# Patient Record
Sex: Male | Born: 1980 | Race: Black or African American | Hispanic: No | Marital: Married | Smoking: Current every day smoker
Health system: Southern US, Community
[De-identification: ages and names within clinical notes are randomized; demographics above are authoritative.]

## PROBLEM LIST (undated history)

## (undated) DIAGNOSIS — E7800 Pure hypercholesterolemia, unspecified: Secondary | ICD-10-CM

## (undated) DIAGNOSIS — F419 Anxiety disorder, unspecified: Secondary | ICD-10-CM

---

## 2011-04-24 ENCOUNTER — Ambulatory Visit: Payer: Self-pay

## 2011-12-06 ENCOUNTER — Ambulatory Visit: Payer: Self-pay | Admitting: Emergency Medicine

## 2012-08-18 ENCOUNTER — Ambulatory Visit: Payer: Self-pay | Admitting: Family Medicine

## 2013-10-15 ENCOUNTER — Ambulatory Visit: Payer: Self-pay | Admitting: Physician Assistant

## 2013-12-30 ENCOUNTER — Emergency Department: Payer: Self-pay | Admitting: Emergency Medicine

## 2017-02-16 ENCOUNTER — Encounter: Payer: Self-pay | Admitting: *Deleted

## 2017-02-16 ENCOUNTER — Emergency Department
Admission: EM | Admit: 2017-02-16 | Discharge: 2017-02-16 | Disposition: A | Discharge status: Home / Self Care | Payer: BLUE CROSS/BLUE SHIELD | Attending: Emergency Medicine | Admitting: Emergency Medicine

## 2017-02-16 DIAGNOSIS — S0592XA Unspecified injury of left eye and orbit, initial encounter: Secondary | ICD-10-CM | POA: Diagnosis present

## 2017-02-16 DIAGNOSIS — S0502XA Injury of conjunctiva and corneal abrasion without foreign body, left eye, initial encounter: Secondary | ICD-10-CM | POA: Insufficient documentation

## 2017-02-16 DIAGNOSIS — Y929 Unspecified place or not applicable: Secondary | ICD-10-CM | POA: Diagnosis not present

## 2017-02-16 DIAGNOSIS — Y9389 Activity, other specified: Secondary | ICD-10-CM | POA: Diagnosis not present

## 2017-02-16 DIAGNOSIS — Y999 Unspecified external cause status: Secondary | ICD-10-CM | POA: Insufficient documentation

## 2017-02-16 DIAGNOSIS — W208XXA Other cause of strike by thrown, projected or falling object, initial encounter: Secondary | ICD-10-CM | POA: Diagnosis not present

## 2017-02-16 MED ORDER — FLUORESCEIN SODIUM 0.6 MG OP STRP
1.0000 | ORAL_STRIP | Freq: Once | OPHTHALMIC | Status: AC
  Administered 2017-02-16: 1 via OPHTHALMIC
  Filled 2017-02-16: qty 1

## 2017-02-16 MED ORDER — POLYMYXIN B-TRIMETHOPRIM 10000-0.1 UNIT/ML-% OP SOLN: 2.0000 [drp] | Freq: Four times a day (QID) | OPHTHALMIC | 0 refills | Status: DC

## 2017-02-16 MED ORDER — KETOROLAC TROMETHAMINE 0.5 % OP SOLN: 1.0000 [drp] | Freq: Four times a day (QID) | OPHTHALMIC | 0 refills | Status: DC

## 2017-02-16 MED ORDER — TETRACAINE HCL 0.5 % OP SOLN
2.0000 [drp] | Freq: Once | OPHTHALMIC | Status: AC
  Administered 2017-02-16: 2 [drp] via OPHTHALMIC
  Filled 2017-02-16: qty 4

## 2017-02-16 NOTE — ED Provider Notes (Signed)
[facility] Emergency Department Provider Note  ____________________________________________  Time seen: Approximately 11:12 PM  I have reviewed the triage vital signs and the nursing notes.   HISTORY  Chief Complaint Eye Pain    HPI Bruce Evans is a 36 y.o. male who presents to emergency department complaining of left eye pain. Patient reports he was trying to change a headlight this morning when the plastic broke and hit him in the eye. Since then, patient has had pain and excessive tearing to his left eye. Patient denies any foreign body sensation. He denies any other injury or complaint. No medications prior to arrival.   No past medical history on file.  There are no active problems to display for this patient.   No past surgical history on file.  Prior to Admission medications   Medication Sig Start Date End Date Taking? Authorizing Provider  ketorolac (ACULAR) 0.5 % ophthalmic solution Place 1 drop into the left eye 4 (four) times daily. 02/16/17   Miya Luviano, Delorise Royals, PA-C  trimethoprim-polymyxin b (POLYTRIM) ophthalmic solution Place 2 drops into the left eye every 6 (six) hours. 02/16/17   Jhanae Jaskowiak, Delorise Royals, PA-C    Allergies Patient has no known allergies.  No family history on file.  Social History Social History  Substance Use Topics  . Smoking status: Never Smoker  . Smokeless tobacco: Never Used  . Alcohol use Yes     Review of Systems  Constitutional: No fever/chills Eyes: No visual changes. No discharge. Positive for pain and excessive tearing to the left eye ENT: No upper respiratory complaints. Cardiovascular: no chest pain. Respiratory: no cough. No SOB. Gastrointestinal: No abdominal pain.  No nausea, no vomiting.  Musculoskeletal: Negative for musculoskeletal pain. Skin: Negative for rash, abrasions, lacerations, ecchymosis. Neurological: Negative for headaches, focal weakness or numbness. 10-point ROS otherwise  negative.  ____________________________________________   PHYSICAL EXAM:  VITAL SIGNS: ED Triage Vitals  Enc Vitals Group     BP 02/16/17 2232 (!) 151/79     Pulse Rate 02/16/17 2232 70     Resp 02/16/17 2232 16     Temp 02/16/17 2232 98.7 F (37.1 C)     Temp Source 02/16/17 2232 Oral     SpO2 02/16/17 2232 99 %     Weight 02/16/17 2229 235 lb (106.6 kg)     Height 02/16/17 2229 5\' 9"  (1.753 m)     Head Circumference --      Peak Flow --      Pain Score 02/16/17 2228 6     Pain Loc --      Pain Edu? --      Excl. in GC? --      Constitutional: Alert and oriented. Well appearing and in no acute distress. Eyes: Conjunctivae are normal. PERRL. EOMI.Initial exam funduscopic exam reveals red reflex bilaterally, good vasculature and optic disc. No visible laceration or foreign body. Eyes anesthetized using tetracaine. Fluorescein staining is applied with area of uptake in the 1:00 position. This is consistent with corneal abrasion. No visible foreign body. Head: Atraumatic. ENT:      Ears:       Nose: No congestion/rhinnorhea.      Mouth/Throat: Mucous membranes are moist.  Neck: No stridor.    Cardiovascular: Normal rate, regular rhythm. Normal S1 and S2.  Good peripheral circulation. Respiratory: Normal respiratory effort without tachypnea or retractions. Lungs CTAB. Good air entry to the bases with no decreased or absent breath sounds Musculoskeletal: Full range  of motion to all extremities. No gross deformities appreciated. Neurologic:  Normal speech and language. No gross focal neurologic deficits are appreciated.  Skin:  Skin is warm, dry and intact. No rash noted. Psychiatric: Mood and affect are normal. Speech and behavior are normal. Patient exhibits appropriate insight and judgement.   ____________________________________________   LABS (all labs ordered are listed, but only abnormal results are displayed)  Labs Reviewed - No data to  display ____________________________________________  EKG   ____________________________________________  RADIOLOGY   No results found.  ____________________________________________    PROCEDURES  Procedure(s) performed:    Procedures    Medications  fluorescein ophthalmic strip 1 strip (1 strip Left Eye Given 02/16/17 2301)  tetracaine (PONTOCAINE) 0.5 % ophthalmic solution 2 drop (2 drops Left Eye Given 02/16/17 2301)     ____________________________________________   INITIAL IMPRESSION / ASSESSMENT AND PLAN / ED COURSE  Pertinent labs & imaging results that were available during my care of the patient were reviewed by me and considered in my medical decision making (see chart for details).  Review of the [place] CSRS was performed in accordance of the [facility] prior to dispensing any controlled drugs.     Patient's diagnosis is consistent with corneal abrasion to the left eye.. Patient will be discharged home with prescriptions for Polytrim and Acular. Patient is to follow up with ophthalmology as needed or otherwise directed. Patient is given ED precautions to return to the ED for any worsening or new symptoms.     ____________________________________________  FINAL CLINICAL IMPRESSION(S) / ED DIAGNOSES  Final diagnoses:  Abrasion of left cornea, initial encounter      NEW MEDICATIONS STARTED DURING THIS VISIT:  New Prescriptions   KETOROLAC (ACULAR) 0.5 % OPHTHALMIC SOLUTION    Place 1 drop into the left eye 4 (four) times daily.   TRIMETHOPRIM-POLYMYXIN B (POLYTRIM) OPHTHALMIC SOLUTION    Place 2 drops into the left eye every 6 (six) hours.        This chart was dictated using voice recognition software/Dragon. Despite best efforts to proofread, errors can occur which can change the meaning. Any change was purely unintentional.    Racheal Patches, PA-C 02/16/17 2316    Dionne Bucy, MD 02/16/17 [phone]

## 2017-02-16 NOTE — ED Notes (Addendum)
Pt was changing headlight in car and plastic hit him in left eye. Eye throbbing and watering.

## 2017-02-16 NOTE — ED Triage Notes (Signed)
Pt has pain in left eye.  States was changing a headlight this morning and a piece of plastic struck pt in left eye,  No loc.  Pt has no blurred vision.  Pt alert.

## 2018-01-24 ENCOUNTER — Encounter: Payer: Self-pay | Admitting: Emergency Medicine

## 2018-01-24 ENCOUNTER — Other Ambulatory Visit: Payer: Self-pay

## 2018-01-24 ENCOUNTER — Ambulatory Visit
Admission: EM | Admit: 2018-01-24 | Discharge: 2018-01-24 | Disposition: A | Discharge status: Home / Self Care | Payer: BLUE CROSS/BLUE SHIELD | Attending: Family Medicine | Admitting: Family Medicine

## 2018-01-24 DIAGNOSIS — H6981 Other specified disorders of Eustachian tube, right ear: Secondary | ICD-10-CM

## 2018-01-24 MED ORDER — NAPROXEN 500 MG PO TABS: 500.0000 mg | ORAL_TABLET | Freq: Two times a day (BID) | ORAL | 0 refills | Status: DC

## 2018-01-24 MED ORDER — FLUTICASONE PROPIONATE 50 MCG/ACT NA SUSP: 2.0000 | Freq: Every day | NASAL | 0 refills | Status: DC

## 2018-01-24 MED ORDER — FEXOFENADINE-PSEUDOEPHED ER 60-120 MG PO TB12: 1.0000 | ORAL_TABLET | Freq: Two times a day (BID) | ORAL | 0 refills | Status: DC

## 2018-01-24 NOTE — ED Provider Notes (Signed)
[facility]    CSN: [id number] Arrival date & time: 01/24/18  1803     History   Chief Complaint Chief Complaint  Patient presents with  . Otalgia    right    HPI Bruce Evans is a 37 y.o. male.   HPI -year-old male presents with right ear pain that started this morning.  Actually it has been hurting him on and off for over several weeks.  His morning is particularly worse with the pain mostly inferior to the ear itself and into the throat.  In addition he has had pain on the crown of his head that is not tender but just feels that when the more severely today.  He had no tinnitus.  He has had no discharge from the ear.  No decrease in hearing.  [place] but  denies any flights recent colds or allergies.  No fever or chills he is afebrile today.         History reviewed. No pertinent past medical history.  There are no active problems to display for this patient.   History reviewed. No pertinent surgical history.     Home Medications    Prior to Admission medications   Medication Sig Start Date End Date Taking? Authorizing Provider  atorvastatin (LIPITOR) 10 MG tablet  01/04/18   [provider]  fexofenadine-pseudoephedrine (ALLEGRA-D) 60-120 MG 12 hr tablet Take 1 tablet by mouth every 12 (twelve) hours. 01/24/18   Lutricia Feil, PA-C  fluticasone (FLONASE) 50 MCG/ACT nasal spray Place 2 sprays into both nostrils daily. 01/24/18   Lutricia Feil, PA-C  naproxen (NAPROSYN) 500 MG tablet Take 1 tablet (500 mg total) by mouth 2 (two) times daily. 01/24/18   Ovid Curd P, PA-C  VIIBRYD 20 MG TABS TAKE 1 TABLET BY MOUTH ONCE DAILY FOR 30 DAYS 01/07/18   [provider]    Family History History reviewed. No pertinent family history.  Social History Social History   Tobacco Use  . Smoking status: Never Smoker  . Smokeless tobacco: Never Used  Substance Use Topics  . Alcohol use: Yes  . Drug use:  No     Allergies   Patient has no known allergies.   Review of Systems Review of Systems  Constitutional: Positive for activity change. Negative for appetite change, chills, fatigue and fever.  HENT: Positive for ear pain. Negative for ear discharge and tinnitus.   All other systems reviewed and are negative.    Physical Exam Triage Vital Signs ED Triage Vitals  Enc Vitals Group     BP 01/24/18 1817 121/87     Pulse Rate 01/24/18 1817 69     Resp 01/24/18 1817 16     Temp 01/24/18 1817 98.8 F (37.1 C)     Temp Source 01/24/18 1817 Oral     SpO2 01/24/18 1817 99 %     Weight 01/24/18 1814 255 lb (115.7 kg)     Height 01/24/18 1814 5\' 9"  (1.753 m)     Head Circumference --      Peak Flow --      Pain Score 01/24/18 1814 5     Pain Loc --      Pain Edu? --      Excl. in GC? --    No data found.  Updated Vital Signs BP 121/87 (BP Location: Left Arm)   Pulse 69   Temp 98.8 F (37.1 C) (Oral)  Resp 16   Ht 5\' 9"  (1.753 m)   Wt 255 lb (115.7 kg)   SpO2 99%   BMI 37.66 kg/m   Visual Acuity Right Eye Distance:   Left Eye Distance:   Bilateral Distance:    Right Eye Near:   Left Eye Near:    Bilateral Near:     Physical Exam  Constitutional: He is oriented to person, place, and time. He appears well-developed and well-nourished. No distress.  HENT:  Head: Normocephalic.  Right Ear: External ear normal.  Left Ear: External ear normal.  Nose: Nose normal.  Mouth/Throat: Oropharynx is clear and moist. No oropharyngeal exudate.  Patient's right TM is dull and retracted with a fluid level seen posteriorly in the TM.  Eyes: Pupils are equal, round, and reactive to light. Right eye exhibits no discharge. Left eye exhibits no discharge.  Neck: Normal range of motion.  Musculoskeletal: Normal range of motion.  Neurological: He is alert and oriented to person, place, and time.  Skin: Skin is warm and dry. He is not diaphoretic.  Psychiatric: He has a normal mood  and affect. His behavior is normal. Judgment and thought content normal.  Nursing note and vitals reviewed.    UC Treatments / Results  Labs (all labs ordered are listed, but only abnormal results are displayed) Labs Reviewed - No data to display  EKG None  Radiology No results found.  Procedures Procedures (including critical care time)  Medications Ordered in UC Medications - No data to display  Initial Impression / Assessment and Plan / UC Course  I have reviewed the triage vital signs and the nursing notes.  Pertinent labs & imaging results that were available during my care of the patient were reviewed by me and considered in my medical decision making (see chart for details).     Plan: 1. Test/x-ray results and diagnosis reviewed with patient 2. rx as per orders; risks, benefits, potential side effects reviewed with patient 3. Recommend supportive treatment with Flonase  nasal spray and decongestants.  Will prescribe Naprosyn for his headache.  If he is not noticing any improvement, I've given him the phone number of [facility] that he can arrange an appointment. 4. F/u prn if symptoms worsen or don't improve  Final Clinical Impressions(s) / UC Diagnoses   Final diagnoses:  Eustachian tube dysfunction, right     Discharge Instructions     Taken Naprosyn for pain.  Be sure to take this with food.   If You are not improving with your ear pain in 2 weeks follow-up with [facility]   ED Prescriptions    Medication Sig Dispense Auth. Provider   fluticasone (FLONASE) 50 MCG/ACT nasal spray Place 2 sprays into both nostrils daily. 16 g Ovid Curdoemer, Jasmynn Pfalzgraf P, PA-C   fexofenadine-pseudoephedrine (ALLEGRA-D) 60-120 MG 12 hr tablet Take 1 tablet by mouth every 12 (twelve) hours. 30 tablet Ovid Curdoemer, Sumaiyah Markert P, PA-C   naproxen (NAPROSYN) 500 MG tablet Take 1 tablet (500 mg total) by mouth 2 (two) times daily. 30 tablet Lutricia Feiloemer, Jessicia Napolitano P, PA-C       Controlled Substance Prescriptions [place] Controlled Substance Registry consulted? Not Applicable   Lutricia FeilRoemer, Elieser Tetrick P, PA-C 01/24/18 2010

## 2018-01-24 NOTE — Discharge Instructions (Signed)
Taken Naprosyn for pain.  Be sure to take this with food.   If You are not improving with your ear pain in 2 weeks follow-up with [facility]

## 2018-01-24 NOTE — ED Triage Notes (Signed)
Patient c/o right ear pain that started this morning.

## 2019-02-08 ENCOUNTER — Other Ambulatory Visit: Payer: Self-pay

## 2019-02-08 ENCOUNTER — Encounter: Payer: Self-pay | Admitting: Emergency Medicine

## 2019-02-08 ENCOUNTER — Ambulatory Visit (INDEPENDENT_AMBULATORY_CARE_PROVIDER_SITE_OTHER): Payer: BC Managed Care – PPO

## 2019-02-08 ENCOUNTER — Ambulatory Visit
Admission: EM | Admit: 2019-02-08 | Discharge: 2019-02-08 | Disposition: A | Discharge status: Home / Self Care | Payer: BC Managed Care – PPO | Attending: Family Medicine | Admitting: Family Medicine

## 2019-02-08 DIAGNOSIS — S99922A Unspecified injury of left foot, initial encounter: Secondary | ICD-10-CM | POA: Diagnosis not present

## 2019-02-08 DIAGNOSIS — M79675 Pain in left toe(s): Secondary | ICD-10-CM

## 2019-02-08 DIAGNOSIS — W28XXXA Contact with powered lawn mower, initial encounter: Secondary | ICD-10-CM

## 2019-02-08 HISTORY — DX: Anxiety disorder, unspecified: F41.9

## 2019-02-08 HISTORY — DX: Pure hypercholesterolemia, unspecified: E78.00

## 2019-02-08 MED ORDER — KETOROLAC TROMETHAMINE 60 MG/2ML IM SOLN
60.0000 mg | Freq: Once | INTRAMUSCULAR | Status: AC
  Administered 2019-02-08: 18:00:00 60 mg via INTRAMUSCULAR

## 2019-02-08 MED ORDER — MELOXICAM 15 MG PO TABS: 15.0000 mg | ORAL_TABLET | Freq: Every day | ORAL | 0 refills | Status: DC | PRN

## 2019-02-08 MED ORDER — HYDROCODONE-ACETAMINOPHEN 5-325 MG PO TABS: 1.0000 | ORAL_TABLET | Freq: Three times a day (TID) | ORAL | 0 refills | Status: DC | PRN

## 2019-02-08 NOTE — ED Triage Notes (Signed)
Patient c/o left great toe laceration about an hour ago. He was mowing and his foot went under the lawn mower. Patient is up to date with tetanus.

## 2019-02-08 NOTE — ED Provider Notes (Signed)
[facility]    CSN: [id number] Arrival date & time: 02/08/19  1709  History   Chief Complaint Chief Complaint  Patient presents with  . Extremity Laceration    left great toe   HPI  38 year old male presents with the above complaint.  Patient was mowing grass this afternoon/evening and tripped while going down a hill he tripped and his foot went under the lawn mower. He suffered an avulsion of the dorsum of the left great toe. Pain is 7/10 in severity. Mild bleeding currently. Tetanus is up to date and was in the last 1-2 years. No other injuries. No other associated symptoms. No other complaints.   History reviewed as below.  Past Medical History:  Diagnosis Date  . Anxiety   . Hypercholesteremia   Tobacco abuse  Home Medications    Prior to Admission medications   Medication Sig Start Date End Date Taking? Authorizing Provider  atorvastatin (LIPITOR) 10 MG tablet  01/04/18  Yes [provider]  DULoxetine (CYMBALTA) 30 MG capsule TAKE 1 CAPSULE BY MOUTH ONCE DAILY FOR 30 DAYS 01/04/19   [provider]  HYDROcodone-acetaminophen (NORCO/VICODIN) 5-325 MG tablet Take 1 tablet by mouth every 8 (eight) hours as needed for moderate pain or severe pain. 02/08/19   Coral Spikes, DO  meloxicam (MOBIC) 15 MG tablet Take 1 tablet (15 mg total) by mouth daily as needed. 02/08/19   Coral Spikes, DO  fluticasone (FLONASE) 50 MCG/ACT nasal spray Place 2 sprays into both nostrils daily. 01/24/18 02/08/19  Crecencio Mc P, PA-C  VIIBRYD 20 MG TABS TAKE 1 TABLET BY MOUTH ONCE DAILY FOR 30 DAYS 01/07/18 02/08/19  [provider]   Social History Social History   Tobacco Use  . Smoking status: Current Every Day Smoker    Packs/day: 0.50  . Smokeless tobacco: Never Used  Substance Use Topics  . Alcohol use: Yes  . Drug use: No     Allergies   Patient has no known allergies.   Review of Systems Review of Systems  Constitutional: Negative.    Skin: Positive for wound.   Physical Exam Triage Vital Signs ED Triage Vitals  Enc Vitals Group     BP 02/08/19 1805 127/74     Pulse Rate 02/08/19 1805 62     Resp 02/08/19 1805 18     Temp 02/08/19 1805 98.2 F (36.8 C)     Temp Source 02/08/19 1805 Oral     SpO2 02/08/19 1805 100 %     Weight 02/08/19 1735 250 lb (113.4 kg)     Height 02/08/19 1735 5\' 9"  (1.753 m)     Head Circumference --      Peak Flow --      Pain Score 02/08/19 1735 7     Pain Loc --      Pain Edu? --      Excl. in [place]? --    Updated Vital Signs BP 127/74 (BP Location: Right Arm)   Pulse 62   Temp 98.2 F (36.8 C) (Oral)   Resp 18   Ht 5\' 9"  (1.753 m)   Wt 113.4 kg   SpO2 100%   BMI 36.92 kg/m   Visual Acuity Right Eye Distance:   Left Eye Distance:   Bilateral Distance:    Right Eye Near:   Left Eye Near:    Bilateral Near:     Physical Exam Vitals signs and nursing note reviewed.  Constitutional:  General: He is not in acute distress.    Appearance: Normal appearance. He is diaphoretic.  HENT:     Head: Normocephalic and atraumatic.  Eyes:     General:        Right eye: No discharge.        Left eye: No discharge.     Conjunctiva/sclera: Conjunctivae normal.  Cardiovascular:     Rate and Rhythm: Normal rate and regular rhythm.     Heart sounds: No murmur.  Pulmonary:     Effort: Pulmonary effort is normal. No respiratory distress.     Breath sounds: No wheezing, rhonchi or rales.  Musculoskeletal:     Comments: Left great toe - distal 2/3 of the nail is gone with associated soft tissue injury. No visible bone  Neurological:     Mental Status: He is alert.     Sensory: Sensory deficit present.  Psychiatric:        Mood and Affect: Mood normal.        Behavior: Behavior normal.      UC Treatments / Results  Labs (all labs ordered are listed, but only abnormal results are displayed) Labs Reviewed - No data to display  EKG   Radiology Dg Toe Great Left   Result Date: 02/08/2019 CLINICAL DATA:  Lawnmower injury EXAM: LEFT GREAT TOE COMPARISON:  None. FINDINGS: No fracture or malalignment. Soft tissue amputation of the distal digit. No radiopaque foreign body IMPRESSION: Soft tissue amputation distal first digit. No definite acute osseous abnormality Electronically Signed   By: Jasmine PangKim  Fujinaga M.D.   On: 02/08/2019 17:44    Procedures Procedures (including critical care time)  Medications Ordered in UC Medications  ketorolac (TORADOL) injection 60 mg (60 mg Intramuscular Given 02/08/19 1804)    Initial Impression / Assessment and Plan / UC Course  I have reviewed the triage vital signs and the nursing notes.  Pertinent labs & imaging results that were available during my care of the patient were reviewed by me and considered in my medical decision making (see chart for details).    38 year old male presents with an injury to the left great toe. Soft tissue injury/amputation. Xray negative. Toradol given for pain. Wound dressed. Sending home on Mobic and PRN Tramadol. [place] database reviewed. No concerns. Advised to call podiatry.  Final Clinical Impressions(s) / UC Diagnoses   Final diagnoses:  Injury of toe on left foot, initial encounter     Discharge Instructions     Keep clean.  Change dressing daily.   Pain medication as directed.  Call Podiatry - Dr. Orland Jarredroxler or Ether GriffinsFowler - [phone]   ED Prescriptions    Medication Sig Dispense Auth. Provider   meloxicam (MOBIC) 15 MG tablet Take 1 tablet (15 mg total) by mouth daily as needed. 30 tablet Lamira Borin G, DO   HYDROcodone-acetaminophen (NORCO/VICODIN) 5-325 MG tablet Take 1 tablet by mouth every 8 (eight) hours as needed for moderate pain or severe pain. 10 tablet Tommie Samsook, Seng Larch G, DO     Controlled Substance Prescriptions [place] Controlled Substance Registry consulted? Yes, I have consulted the [place] Controlled Substances Registry for this patient, and feel the risk/benefit ratio today  is favorable for proceeding with this prescription for a controlled substance.   Tommie SamsCook, Athony Coppa G, [place] 02/08/19 2208

## 2019-02-08 NOTE — Discharge Instructions (Signed)
Keep clean.  Change dressing daily.   Pain medication as directed.  Call Podiatry - Dr. Elvina Mattes or Vickki Muff - [phone]

## 2020-09-07 ENCOUNTER — Other Ambulatory Visit: Payer: Self-pay

## 2020-09-07 ENCOUNTER — Ambulatory Visit
Admission: EM | Admit: 2020-09-07 | Discharge: 2020-09-07 | Disposition: A | Discharge status: Home / Self Care | Payer: BC Managed Care – PPO | Attending: Physician Assistant | Admitting: Physician Assistant

## 2020-09-07 ENCOUNTER — Encounter: Payer: Self-pay | Admitting: Emergency Medicine

## 2020-09-07 DIAGNOSIS — L03011 Cellulitis of right finger: Secondary | ICD-10-CM | POA: Diagnosis not present

## 2020-09-07 MED ORDER — SULFAMETHOXAZOLE-TRIMETHOPRIM 800-160 MG PO TABS: 1.0000 | ORAL_TABLET | Freq: Two times a day (BID) | ORAL | 0 refills | Status: AC

## 2020-09-07 MED ORDER — MUPIROCIN 2 % EX OINT: 1.0000 "application " | TOPICAL_OINTMENT | Freq: Three times a day (TID) | CUTANEOUS | 0 refills | Status: AC

## 2020-09-07 NOTE — ED Provider Notes (Signed)
[facility]    CSN: [id number] Arrival date & time: 09/07/20  0941      History   Chief Complaint Chief Complaint  Patient presents with  . Hand Pain    HPI Bruce Evans is a 40 y.o. male presenting for suspected infection of the right index finger.  Patient states that he has noticed some swelling and increased redness for about 4 to 5 days.  He says that his wife squeezed his finger and some pus came out from around the cuticle yesterday.  He denies any injury but does admit to chronic nail biting.  He has not had any fevers.  Denies any similar problems in the past.  Not take anything for pain.  No other concerns.  HPI  Past Medical History:  Diagnosis Date  . Anxiety   . Hypercholesteremia     There are no problems to display for this patient.   History reviewed. No pertinent surgical history.     Home Medications    Prior to Admission medications   Medication Sig Start Date End Date Taking? Authorizing Provider  mupirocin ointment (BACTROBAN) 2 % Apply 1 application topically 3 (three) times daily for 7 days. 09/07/20 09/14/20 Yes Eusebio Friendly B, PA-C  sulfamethoxazole-trimethoprim (BACTRIM DS) 800-160 MG tablet Take 1 tablet by mouth 2 (two) times daily for 5 days. 09/07/20 09/12/20 Yes Eusebio Friendly B, PA-C  atorvastatin (LIPITOR) 10 MG tablet  01/04/18   [provider]  DULoxetine (CYMBALTA) 30 MG capsule TAKE 1 CAPSULE BY MOUTH ONCE DAILY FOR 30 DAYS 01/04/19   [provider]  HYDROcodone-acetaminophen (NORCO/VICODIN) 5-325 MG tablet Take 1 tablet by mouth every 8 (eight) hours as needed for moderate pain or severe pain. 02/08/19   Tommie Sams, DO  meloxicam (MOBIC) 15 MG tablet Take 1 tablet (15 mg total) by mouth daily as needed. 02/08/19   Tommie Sams, DO  fluticasone (FLONASE) 50 MCG/ACT nasal spray Place 2 sprays into both nostrils daily. 01/24/18 02/08/19  Ovid Curd P, PA-C  VIIBRYD 20 MG TABS TAKE 1 TABLET BY MOUTH ONCE DAILY  FOR 30 DAYS 01/07/18 02/08/19  [provider]    Family History History reviewed. No pertinent family history.  Social History Social History   Tobacco Use  . Smoking status: Current Every Day Smoker    Packs/day: 0.50  . Smokeless tobacco: Never Used  Vaping Use  . Vaping Use: Never used  Substance Use Topics  . Alcohol use: Yes  . Drug use: No     Allergies   Patient has no known allergies.   Review of Systems Review of Systems  Constitutional: Negative for fatigue and fever.  Musculoskeletal: Negative for arthralgias and joint swelling.  Skin: Positive for color change. Negative for wound.  Neurological: Negative for weakness and numbness.     Physical Exam Triage Vital Signs ED Triage Vitals  Enc Vitals Group     BP 09/07/20 1023 125/85     Pulse Rate 09/07/20 1023 75     Resp 09/07/20 1023 18     Temp 09/07/20 1023 98.2 F (36.8 C)     Temp Source 09/07/20 1023 Oral     SpO2 09/07/20 1023 99 %     Weight --      Height --      Head Circumference --      Peak Flow --      Pain Score 09/07/20 1025 7     Pain Loc --  Pain Edu? --      Excl. in GC? --    No data found.  Updated Vital Signs BP 125/85 (BP Location: Left Arm)   Pulse 75   Temp 98.2 F (36.8 C) (Oral)   Resp 18   SpO2 99%       Physical Exam Vitals and nursing note reviewed.  Constitutional:      General: He is not in acute distress.    Appearance: Normal appearance. He is well-developed. He is not ill-appearing.  HENT:     Head: Normocephalic and atraumatic.  Eyes:     General: No scleral icterus.    Conjunctiva/sclera: Conjunctivae normal.  Cardiovascular:     Rate and Rhythm: Normal rate.     Pulses: Normal pulses.  Pulmonary:     Effort: Pulmonary effort is normal. No respiratory distress.  Musculoskeletal:     Cervical back: Neck supple.  Skin:    General: Skin is warm and dry.     Comments: Right index finger: Small paronychia of the lateral cuticle.   There is some surrounding erythema and mild swelling.  No drainage noted.  No fluctuance.  No bony tenderness or swelling of any joints.  Neurological:     General: No focal deficit present.     Mental Status: He is alert. Mental status is at baseline.     Motor: No weakness.  Psychiatric:        Mood and Affect: Mood normal.        Behavior: Behavior normal.        Thought Content: Thought content normal.      UC Treatments / Results  Labs (all labs ordered are listed, but only abnormal results are displayed) Labs Reviewed - No data to display  EKG   Radiology No results found.  Procedures Procedures (including critical care time)  Medications Ordered in UC Medications - No data to display  Initial Impression / Assessment and Plan / UC Course  I have reviewed the triage vital signs and the nursing notes.  Pertinent labs & imaging results that were available during my care of the patient were reviewed by me and considered in my medical decision making (see chart for details).   40 year old male presenting for paronychia of the right index finger.  Exam reveals a minor infection.  No need to attempt I&D based on his exam.  Treating with supportive care at this time with warm soaks and mupirocin ointment.  Also sent short course of Bactrim DS.  Advised to follow-up with our clinic as needed for any worsening symptoms.  Encouraged him not to bite his nails.   Final Clinical Impressions(s) / UC Diagnoses   Final diagnoses:  Paronychia of finger of right hand     Discharge Instructions     You have a very minor infection of your finger.  Make sure you are doing warm soaks a couple of times throughout the day.  This may facilitate some more drainage and that is okay.  Apply the mupirocin ointment afterwards.  Take a very short course of oral antibiotic.  If you were swelling, redness or drainage worsens you should be seen again.    ED Prescriptions    Medication Sig  Dispense Auth. Provider   mupirocin ointment (BACTROBAN) 2 % Apply 1 application topically 3 (three) times daily for 7 days. 22 g Eusebio Friendly B, PA-C   sulfamethoxazole-trimethoprim (BACTRIM DS) 800-160 MG tablet Take 1 tablet by mouth 2 (two) times  daily for 5 days. 10 tablet Gareth Morgan     PDMP not reviewed this encounter.   Shirlee Latch, PA-C 09/07/20 1106

## 2020-09-07 NOTE — Discharge Instructions (Signed)
You have a very minor infection of your finger.  Make sure you are doing warm soaks a couple of times throughout the day.  This may facilitate some more drainage and that is okay.  Apply the mupirocin ointment afterwards.  Take a very short course of oral antibiotic.  If you were swelling, redness or drainage worsens you should be seen again.

## 2020-09-07 NOTE — ED Triage Notes (Signed)
Pt states that his finger on his right hand is hot to the touch and had drainage coming from up under the nail. Pt states that the swelling has been going on for four or five days but this morning he noticed the drainage no bleeding

## 2021-01-03 IMAGING — CR LEFT GREAT TOE
3 series · 3 of 3 positions shown · non-contrast
Comparison: None.

CLINICAL DATA: Lawnmower injury

EXAM:
LEFT GREAT TOE

[toe ap]
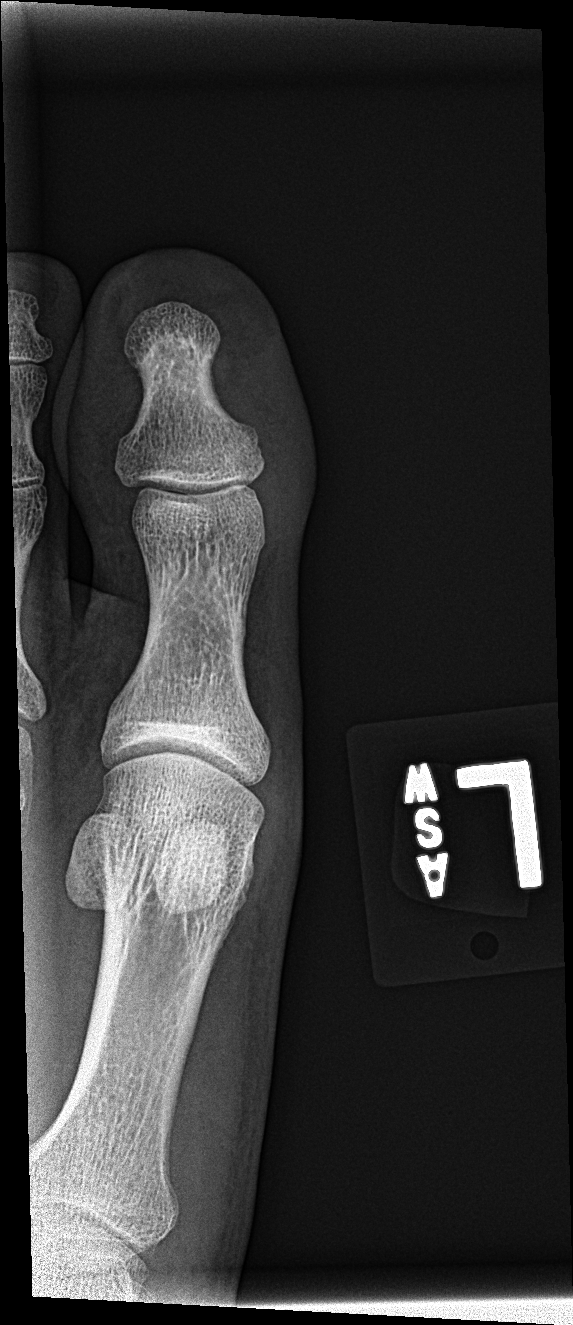

[toe obl]
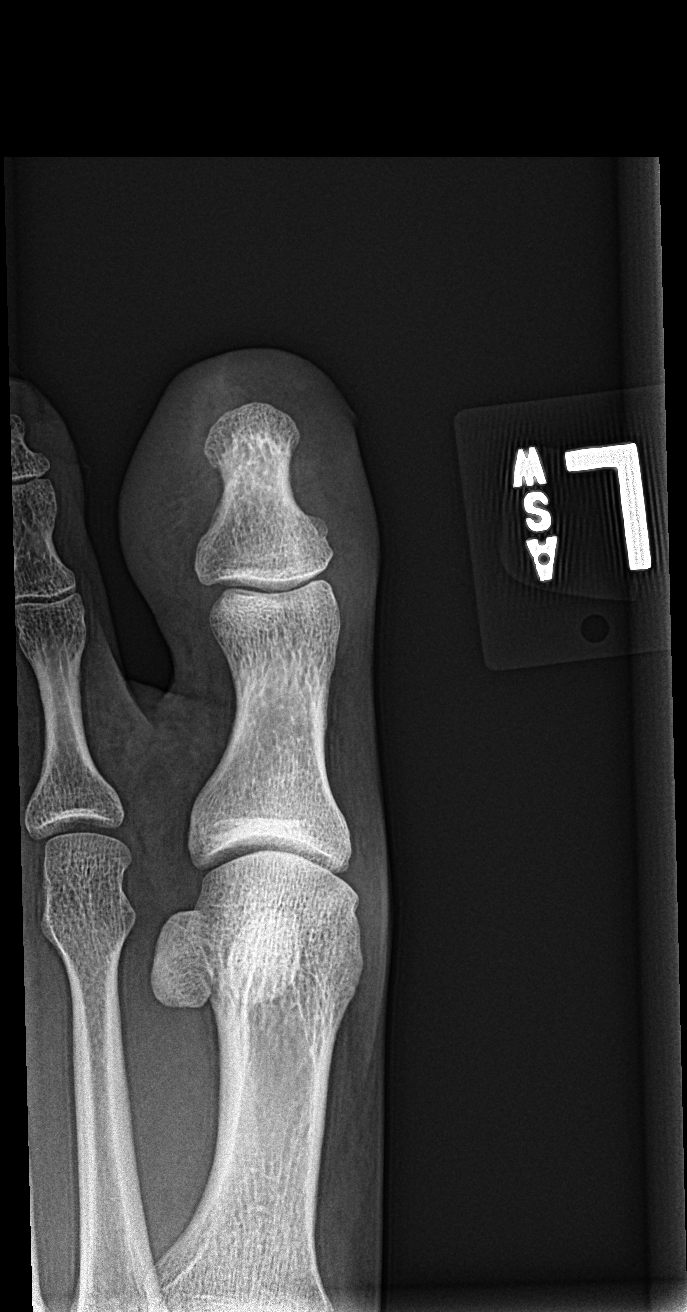

[toe lat]
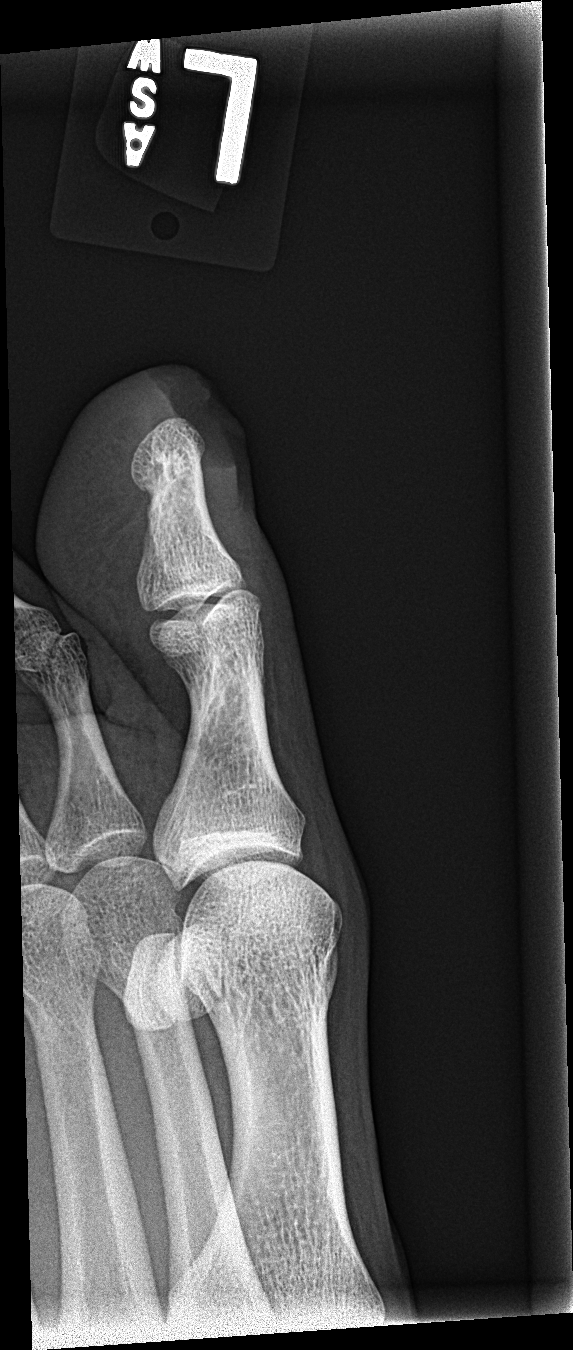

[3 of 3 positions shown; findings below may reference images not displayed]

FINDINGS: No fracture or malalignment. Soft tissue amputation of the distal
digit. No radiopaque foreign body
IMPRESSION: Soft tissue amputation distal first digit. No definite acute osseous
abnormality

## 2023-01-04 ENCOUNTER — Ambulatory Visit
Admission: EM | Admit: 2023-01-04 | Discharge: 2023-01-04 | Disposition: A | Discharge status: Home / Self Care | Payer: BC Managed Care – PPO

## 2023-01-04 ENCOUNTER — Ambulatory Visit: Payer: BC Managed Care – PPO

## 2023-01-04 DIAGNOSIS — R109 Unspecified abdominal pain: Secondary | ICD-10-CM

## 2023-01-04 DIAGNOSIS — R0782 Intercostal pain: Secondary | ICD-10-CM | POA: Diagnosis not present

## 2023-01-04 MED ORDER — PREDNISONE 20 MG PO TABS: 40.0000 mg | ORAL_TABLET | Freq: Every day | ORAL | 0 refills | Status: DC

## 2023-01-04 NOTE — ED Provider Notes (Signed)
[facility]    CSN: [id number] Arrival date & time: 01/04/23  1814      History   Chief Complaint Chief Complaint  Patient presents with   Abdominal Pain    HPI Bruce Evans is a 42 y.o. male.   Patient presents for evaluation of right sudden abdominal pain present for 2 weeks.  Pain has been constant fluctuating intensity has worsened today.  Symptoms are now able to be felt with movement and has begun to radiate across the abdomen.  No accompanying symptoms.  Has attempted use of ibuprofen and Tylenol which have been ineffective.  Denies injury or trauma or precipitating event.  Denies recent dietary changes or changes in medications.  Recently was evaluated by his PCP at annual physical who ordered a abdominal ultrasound, scheduling pending.       Past Medical History:  Diagnosis Date   Anxiety    Hypercholesteremia     There are no problems to display for this patient.   History reviewed. No pertinent surgical history.     Home Medications    Prior to Admission medications   Medication Sig Start Date End Date Taking? Authorizing Provider  ALPRAZolam Prudy Feeler) 0.25 MG tablet Take 1 tablet by mouth daily as needed.   Yes [provider]  atorvastatin (LIPITOR) 10 MG tablet  01/04/18  Yes [provider]  DULoxetine (CYMBALTA) 30 MG capsule TAKE 1 CAPSULE BY MOUTH ONCE DAILY FOR 30 DAYS 01/04/19  Yes [provider]  losartan (COZAAR) 50 MG tablet Take 1 tablet by mouth daily.   Yes [provider]  OLANZapine-FLUoxetine (SYMBYAX) 3-25 MG capsule Take 1 capsule by mouth daily.   Yes [provider]  Semaglutide-Weight Management (WEGOVY) 0.25 MG/0.5ML SOAJ Inject by subcutaneous route for 28 days.   Yes [provider]  temazepam (RESTORIL) 15 MG capsule    Yes [provider]  HYDROcodone-acetaminophen (NORCO/VICODIN) 5-325 MG tablet Take 1 tablet by mouth every 8 (eight) hours as needed for  moderate pain or severe pain. 02/08/19   Tommie Sams, DO  meloxicam (MOBIC) 15 MG tablet Take 1 tablet (15 mg total) by mouth daily as needed. 02/08/19   Tommie Sams, DO  fluticasone (FLONASE) 50 MCG/ACT nasal spray Place 2 sprays into both nostrils daily. 01/24/18 02/08/19  Ovid Curd P, PA-C  VIIBRYD 20 MG TABS TAKE 1 TABLET BY MOUTH ONCE DAILY FOR 30 DAYS 01/07/18 02/08/19  [provider]    Family History History reviewed. No pertinent family history.  Social History Social History   Tobacco Use   Smoking status: Every Day    Current packs/day: 0.50    Types: Cigarettes   Smokeless tobacco: Never  Vaping Use   Vaping status: Never Used  Substance Use Topics   Alcohol use: Yes   Drug use: No     Allergies   Patient has no known allergies.   Review of Systems Review of Systems   Physical Exam Triage Vital Signs ED Triage Vitals  Encounter Vitals Group     BP 01/04/23 1839 (!) 146/88     Systolic BP Percentile --      Diastolic BP Percentile --      Pulse Rate 01/04/23 1839 63     Resp 01/04/23 1839 16     Temp 01/04/23 1839 98.5 F (36.9 C)     Temp Source 01/04/23 1839 Oral     SpO2 01/04/23 1839 95 %  Weight 01/04/23 1838 276 lb (125.2 kg)     Height 01/04/23 1838 5\' 9"  (1.753 m)     Head Circumference --      Peak Flow --      Pain Score 01/04/23 1841 4     Pain Loc --      Pain Education --      Exclude from Growth Chart --    No data found.  Updated Vital Signs BP (!) 146/88 (BP Location: Left Arm)   Pulse 63   Temp 98.5 F (36.9 C) (Oral)   Resp 16   Ht 5\' 9"  (1.753 m)   Wt 276 lb (125.2 kg)   SpO2 95%   BMI 40.76 kg/m   Visual Acuity Right Eye Distance:   Left Eye Distance:   Bilateral Distance:    Right Eye Near:   Left Eye Near:    Bilateral Near:     Physical Exam Constitutional:      Appearance: He is well-developed.  Pulmonary:     Effort: Pulmonary effort is normal.  Chest:     Comments: Tenderness is  present to the right flank approximately over ribs 4-5, no ecchymosis swelling or deformity Abdominal:     General: Abdomen is flat. Bowel sounds are normal.     Palpations: Abdomen is soft.     Tenderness: There is no abdominal tenderness.  Neurological:     Mental Status: He is alert.      UC Treatments / Results  Labs (all labs ordered are listed, but only abnormal results are displayed) Labs Reviewed - No data to display  EKG   Radiology No results found.  Procedures Procedures (including critical care time)  Medications Ordered in UC Medications - No data to display  Initial Impression / Assessment and Plan / UC Course  I have reviewed the triage vital signs and the nursing notes.  Pertinent labs & imaging results that were available during my care of the patient were reviewed by me and considered in my medical decision making (see chart for details).  Right flank pain  Etiology most likely muscular, due to timeline low suspicion for infectious cause, discussed this with patient, no tenderness to the abdominal exam, no respiratory symptoms, rib x-ray negative abdominal ultrasound ordered by PCP, prednisone prescribed May follow-up with PCP if symptoms continue to persist Final Clinical Impressions(s) / UC Diagnoses   Final diagnoses:  None   Discharge Instructions   None    ED Prescriptions   None    PDMP not reviewed this encounter.   Valinda Hoar, [place] 01/07/23 [phone]

## 2023-01-04 NOTE — Discharge Instructions (Addendum)
Based on your examination and your description of your symptoms I do believe your pain today to be muscular  I have a low suspicion that this is organ related due to the timeline that you have been experiencing symptoms, more acute inflammatory or infectious cause which elicit severe pain over a short timeframe with accompanying symptoms such as nausea vomiting diarrhea fevers etc.  Low suspicion that your symptoms are caused by heartburn or indigestion as you would expect to experience gas production chest pressure abdominal pressure belching etc.  Low suspicion that this is related to your kidneys as you are not experiencing any urinary symptoms  Pain is directly over the right side located over the ribs  X-ray of the ribs is negative for injury changes to the bone or the lungs  Starting tomorrow take prednisone every morning with food for 5 days this medicine helps to reduce inflammation and ideally will help to calm your symptoms  May use heat over the affected area in 10 to 15-minute intervals  May continue activity as tolerated  Continue with plan for abdominal ultrasound  If your symptoms continue to persist please follow-up with your primary doctor for reevaluation

## 2023-01-04 NOTE — ED Triage Notes (Signed)
Pt c/o R sided abd pain x2 wks. Denies any N/V/D. States pain worse in the PM, is able to keep food & fluids down.

## 2023-11-21 ENCOUNTER — Ambulatory Visit: Payer: Self-pay | Admitting: Emergency Medicine

## 2023-11-21 ENCOUNTER — Ambulatory Visit
Admission: EM | Admit: 2023-11-21 | Discharge: 2023-11-21 | Disposition: A | Discharge status: Home / Self Care | Attending: Emergency Medicine | Admitting: Emergency Medicine

## 2023-11-21 ENCOUNTER — Ambulatory Visit (INDEPENDENT_AMBULATORY_CARE_PROVIDER_SITE_OTHER)

## 2023-11-21 DIAGNOSIS — R1030 Lower abdominal pain, unspecified: Secondary | ICD-10-CM

## 2023-11-21 DIAGNOSIS — R7989 Other specified abnormal findings of blood chemistry: Secondary | ICD-10-CM | POA: Insufficient documentation

## 2023-11-21 LAB — BASIC METABOLIC PANEL WITH GFR
Anion gap: 10 (ref 5–15)
BUN: 8 mg/dL (ref 6–20)
CO2: 27 mmol/L (ref 22–32)
Calcium: 9.1 mg/dL (ref 8.9–10.3)
Chloride: 99 mmol/L (ref 98–111)
Creatinine, Ser: 1.35 mg/dL — ABNORMAL HIGH (ref 0.61–1.24)
GFR, Estimated: >60 mL/min (ref >60)
Glucose, Bld: 87 mg/dL (ref 70–99)
Potassium: 3.9 mmol/L (ref 3.5–5.1)
Sodium: 136 mmol/L (ref 135–145)

## 2023-11-21 LAB — CBC WITH DIFFERENTIAL/PLATELET
Abs Immature Granulocytes: 0.01 10*3/uL (ref 0.00–0.07)
Basophils Absolute: 0.1 10*3/uL (ref 0.0–0.1)
Basophils Relative: 1 %
Eosinophils Absolute: 0.2 10*3/uL (ref 0.0–0.5)
Eosinophils Relative: 2 %
HCT: 41.5 % (ref 39.0–52.0)
Hemoglobin: 14.6 g/dL (ref 13.0–17.0)
Immature Granulocytes: 0 %
Lymphocytes Relative: 33 %
Lymphs Abs: 3 10*3/uL (ref 0.7–4.0)
MCH: 29.4 pg (ref 26.0–34.0)
MCHC: 35.2 g/dL (ref 30.0–36.0)
MCV: 83.7 fL (ref 80.0–100.0)
Monocytes Absolute: 0.6 10*3/uL (ref 0.1–1.0)
Monocytes Relative: 7 %
Neutro Abs: 5.1 10*3/uL (ref 1.7–7.7)
Neutrophils Relative %: 57 %
Platelets: 194 10*3/uL (ref 150–400)
RBC: 4.96 MIL/uL (ref 4.22–5.81)
RDW: 12.9 % (ref 11.5–15.5)
WBC: 9 10*3/uL (ref 4.0–10.5)
nRBC: 0 % (ref 0.0–0.2)

## 2023-11-21 LAB — URINALYSIS, ROUTINE W REFLEX MICROSCOPIC
Bilirubin Urine: NEGATIVE
Glucose, UA: NEGATIVE mg/dL
Hgb urine dipstick: NEGATIVE
Ketones, ur: NEGATIVE mg/dL
Leukocytes,Ua: NEGATIVE
Nitrite: NEGATIVE
Protein, ur: NEGATIVE mg/dL
Specific Gravity, Urine: 1.01 (ref 1.005–1.030)
pH: 6.5 (ref 5.0–8.0)

## 2023-11-21 NOTE — Discharge Instructions (Signed)
 Your labs and imaging were remarkable  for an elevated serum creatinine.  I do not have any previous values for comparison so I have to assume that this is new.  Increase your fluid intake to at least 100 ounces per day.  I would pause the hydrochlorothiazide for now but keep a close eye on your blood pressure while doing so.  Please follow-up with your primary care provider in [place] in 5 to 7 days for recheck of your kidney function.  May take Tylenol  1000 mg 3-4 times a day as needed for pain.  No ibuprofen.  Go to the ER if your symptoms get worse or for any concerns.

## 2023-11-21 NOTE — ED Provider Notes (Signed)
 HPI  SUBJECTIVE:  Bruce Evans is a 43 y.o. male who presents with 1 week of intermittent low midline abdominal nagging pain that lasts hours accompanied with nausea that radiates to the right lower quadrant.  Symptoms started after having intercourse with his wife.  He reports urinary frequency and intermittent mild right testicular pain that lasts approximately an hour and then resolves.  No fevers, vomiting, dysuria, urgency, cloudy odorous urine, hematuria.  No penile rash, discharge testicular/scrotal redness, swelling or pain, perineal pain or swelling.  Testicular pain does not always occur with the abdominal pain.  He had a normal bowel movement today with no change in his pain.  No abdominal distention.  He is in a long-term monogamous relationship with his wife, who is asymptomatic.  STDs are not a concern today.  He has never had symptoms like this before.  He tried to relax with improvement in his symptoms.  No aggravating factors.  Is not associated with p.o. intake, urination, defecation or movement.  The car ride over here was not painful.  He he has a past medical history of hypertension, hypercholesterolemia.  No history of UTI, pyelonephritis, nephrolithiasis, prostatitis, epididymitis, testicular torsion, atrial fibrillation, mesenteric ischemia, abdominal surgeries.  PCP: In [place].  Wife states that he wears tight jeans with a belt in the area of pain.  Patient denies recent increased exercise, vomiting/diarrhea, states that he drinks "lots of water" during the day.  Past Medical History:  Diagnosis Date   Anxiety    Hypercholesteremia     History reviewed. No pertinent surgical history.  History reviewed. No pertinent family history.  Social History   Tobacco Use   Smoking status: Every Day    Current packs/day: 0.50    Types: Cigarettes   Smokeless tobacco: Never  Vaping Use   Vaping status: Never Used  Substance Use Topics   Alcohol use: Yes   Drug use: No     No current facility-administered medications for this encounter.  Current Outpatient Medications:    atorvastatin (LIPITOR) 10 MG tablet, , Disp: , Rfl:    [Paused] hydrochlorothiazide (HYDRODIURIL) 12.5 MG tablet, Take 12.5 mg by mouth daily., Disp: , Rfl:    losartan (COZAAR) 50 MG tablet, Take 1 tablet by mouth daily., Disp: , Rfl:    ALPRAZolam (XANAX) 0.25 MG tablet, Take 1 tablet by mouth daily as needed., Disp: , Rfl:    DULoxetine (CYMBALTA) 30 MG capsule, TAKE 1 CAPSULE BY MOUTH ONCE DAILY FOR 30 DAYS, Disp: , Rfl:    OLANZapine-FLUoxetine (SYMBYAX) 3-25 MG capsule, Take 1 capsule by mouth daily., Disp: , Rfl:    Semaglutide-Weight Management (WEGOVY) 0.25 MG/0.5ML SOAJ, Inject by subcutaneous route for 28 days., Disp: , Rfl:    temazepam (RESTORIL) 15 MG capsule, , Disp: , Rfl:   No Known Allergies   ROS  As noted in HPI.   Physical Exam  BP 134/86 (BP Location: Left Arm)   Pulse 63   Temp 98.8 F (37.1 C) (Oral)   Resp 16   Ht 5\' 9"  (1.753 m)   Wt 131.5 kg   SpO2 99%   BMI 42.83 kg/m   Constitutional: Well developed, well nourished, no acute distress.  Moving around the room comfortably. Eyes: PERRL, EOMI, conjunctiva normal bilaterally HENT: Normocephalic, atraumatic,mucus membranes moist Respiratory: Normal inspiratory effort Cardiovascular: Normal rate GI: Soft, nondistended, normal bowel sounds,  no rebound, no guarding.  Positive suprapubic tenderness.  No right lower quadrant, left lower quadrant tenderness.  No  flank tenderness.  No other abdominal tenderness. Back: no CVAT GU: Normal circumcised male, testes descended bilaterally.  No penile rash, discharge.  No testicular or epididymal tenderness, swelling.  No inguinal hernia felt or seen on Valsalva.  Chaperone present during exam Rectal: Normal size, nontender firm prostate.  No bogginess.  Chaperone present during exam skin: No rash, skin intact Musculoskeletal: No edema, no tenderness, no  deformities Neurologic: Alert & oriented x 3, CN III-XII grossly intact, no motor deficits, sensation grossly intact Psychiatric: Speech and behavior appropriate   ED Course   Medications - No data to display  Orders Placed This Encounter  Procedures   DG Abd 1 View    Standing Status:   Standing    Number of Occurrences:   1    Reason for Exam (SYMPTOM  OR DIAGNOSIS REQUIRED):   Low midline abdominal pain, evaluate for nephrolithiasis   Urinalysis, Routine w reflex microscopic -Urine, Clean Catch    Standing Status:   Standing    Number of Occurrences:   1    Specimen Source:   Urine, Clean Catch [[id number]]   CBC with Differential    Standing Status:   Standing    Number of Occurrences:   1   Basic metabolic panel    Standing Status:   Standing    Number of Occurrences:   1   Results for orders placed or performed during the hospital encounter of 11/21/23 (from the past 24 hours)  Urinalysis, Routine w reflex microscopic -Urine, Clean Catch     Status: None   Collection Time: 11/21/23  7:47 PM  Result Value Ref Range   Color, Urine YELLOW YELLOW   APPearance CLEAR CLEAR   Specific Gravity, Urine 1.010 1.005 - 1.030   pH 6.5 5.0 - 8.0   Glucose, UA NEGATIVE NEGATIVE mg/dL   Hgb urine dipstick NEGATIVE NEGATIVE   Bilirubin Urine NEGATIVE NEGATIVE   Ketones, ur NEGATIVE NEGATIVE mg/dL   Protein, ur NEGATIVE NEGATIVE mg/dL   Nitrite NEGATIVE NEGATIVE   Leukocytes,Ua NEGATIVE NEGATIVE  CBC with Differential     Status: None   Collection Time: 11/21/23  7:47 PM  Result Value Ref Range   WBC 9.0 4.0 - 10.5 K/uL   RBC 4.96 4.22 - 5.81 MIL/uL   Hemoglobin 14.6 13.0 - 17.0 g/dL   HCT 09.8 11.9 - 14.7 %   MCV 83.7 80.0 - 100.0 fL   MCH 29.4 26.0 - 34.0 pg   MCHC 35.2 30.0 - 36.0 g/dL   RDW 82.9 56.2 - 13.0 %   Platelets 194 150 - 400 K/uL   nRBC 0.0 0.0 - 0.2 %   Neutrophils Relative % 57 %   Neutro Abs 5.1 1.7 - 7.7 K/uL   Lymphocytes Relative 33 %   Lymphs Abs 3.0 0.7 -  4.0 K/uL   Monocytes Relative 7 %   Monocytes Absolute 0.6 0.1 - 1.0 K/uL   Eosinophils Relative 2 %   Eosinophils Absolute 0.2 0.0 - 0.5 K/uL   Basophils Relative 1 %   Basophils Absolute 0.1 0.0 - 0.1 K/uL   Immature Granulocytes 0 %   Abs Immature Granulocytes 0.01 0.00 - 0.07 K/uL  Basic metabolic panel     Status: Abnormal   Collection Time: 11/21/23  7:47 PM  Result Value Ref Range   Sodium 136 135 - 145 mmol/L   Potassium 3.9 3.5 - 5.1 mmol/L   Chloride 99 98 - 111 mmol/L   CO2 27  22 - 32 mmol/L   Glucose, Bld 87 70 - 99 mg/dL   BUN 8 6 - 20 mg/dL   Creatinine, Ser 1.30 (H) 0.61 - 1.24 mg/dL   Calcium 9.1 8.9 - 86.5 mg/dL   GFR, Estimated >78 >46 mL/min   Anion gap 10 5 - 15   DG Abd 1 View Result Date: 11/21/2023 CLINICAL DATA:  Lower abdominal pain, evaluate for nephrolithiasis EXAM: ABDOMEN - 1 VIEW COMPARISON:  None Available. FINDINGS: Nonobstructive bowel gas pattern. No renal or ureteral calculi. Visualized osseous structures are within normal limits. IMPRESSION: Negative.  No renal or ureteral calculi. Electronically Signed   By: Zadie Herter M.D.   On: 11/21/2023 20:30    ED Clinical Impression  1. Lower abdominal pain   2. Elevated serum creatinine      ED Assessment/Plan     Pt abd exam is benign, no peritoneal signs. No evidence of surgical abd. Doubt SBO, mesenteric ischemia, appendicitis, hepatitis, cholecystitis, pancreatitis, or perforated viscus.  No evidence of orchitis, testicular torsion, epididymitis, prostatitis.  Highest in the the differential is UTI/nephrolithiasis.  Will check KUB, CBC, BMP, UA.  Reviewed imaging independently.  No radiopaque calculi.  Will call wife Evalyn Hillier at [phone] if radiology overread differs in the from mine and we need to change management.    Reviewed radiology report.  No nephrolithiasis consistent with my read.  See radiology report for full details.  Labs reviewed. Elevated creatinine.UA negative UTI.   No previous labs for comparision in [facility] or care everywhere.  Labs otherwise normal.  KUB negative for nephrolithiasis.  Sx may be from AKI/elevated creatinine, but pt is not in ARF. Will have pt increase fluids and f/u with PCP in 5-7 days for repeat BMP.  Withhold hydrochlorothiazide for now, but monitor blood pressure closely.  Tylenol  as needed for pain.  ER return precautions given.   Discussed labs, imaging, MDM, treatment plan, and plan for follow-up with patient Discussed sn/sx that should prompt return to the ED. patient agrees with plan.   No orders of the defined types were placed in this encounter.     *This clinic note was created using Dragon dictation software. Therefore, there may be occasional mistakes despite careful proofreading. ?    Ethlyn Herd, MD 11/22/23 [id number]

## 2023-11-21 NOTE — ED Triage Notes (Signed)
 Pt c/o lower abd pain & nausea x1 wk. Denies any emesis or diarrhea. States he got intimate & near the end his pain started.

## 2024-03-21 DIAGNOSIS — N5082 Scrotal pain: Secondary | ICD-10-CM | POA: Insufficient documentation

## 2024-03-21 DIAGNOSIS — N442 Benign cyst of testis: Secondary | ICD-10-CM | POA: Insufficient documentation

## 2024-03-21 NOTE — Progress Notes (Signed)
   03/28/24 10:34 AM   Bruce Evans 1980-09-14 [id number]   HPI: 43 y.o. male here for initial evaluation of a scrotal pain US  Scrotum (03/01/24) - small right intratesticular cyst, otherwise wnl  CT A/P (03/01/24) - Right bladder diverticulum, incidental  Accompanied by his wife today Since referral was placed he broke his right leg after falling from a chair Now nonambulatory-right scrotal discomfort has improved 88-month history of right aching scrotal discomfort, intermittent flares Pain worse with physical activity, sexual activity No history of prior GU conditions, no GU surgeries Has not tried any therapies for scrotal discomfort, no medications    PMH: Past Medical History:  Diagnosis Date   Anxiety    Hypercholesteremia     Surgical History: No past surgical history on file.  Family History: No family history on file.  Social History:  reports that he has been smoking. He has never used smokeless tobacco. He reports current alcohol use. He reports that he does not use drugs.      Physical Exam: BP 132/81   Pulse 76   Ht 5' 9 (1.753 m)   Wt 285 lb (129.3 kg)   BMI 42.09 kg/m    Constitutional:  Alert and oriented, No acute distress. Cardiovascular: No clubbing, cyanosis, or edema. Respiratory: Normal respiratory effort, no increased work of breathing. GI: Nondistended GU: circumcised phallus, morphologically normal bilateral testes, no masses.  Patient localizes scrotal discomfort to the right inferior posterior hemiscrotum overlying tail of epididymis. Skin: No rashes, bruises or suspicious lesions. Neurologic: Grossly intact, no focal deficits, moving all 4 extremities. Psychiatric: Normal mood and affect.  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 For many patients, symptoms are self-limited and respond to conservative management. Initial strategies include NSAIDs, scrotal support, ice packs, and/or warm sitz baths. Identifying exacerbating triggers, modification of activities and/or premedication can help reduce episodes and severity. For chronic syndromes, we may consider pelvic floor physical therapy or referral to a chronic pain specialist. In select cases, we may consider surgical treatment with anesthetic cord blocks +/- spermatic cord denervation.   -Mildly bothersome at present, likely improved with nonambulatory status -Attention to inciting triggers, premedication with NSAIDs - Interval scrotal ultrasound for findings below, as well as chronic scrotal pain - f/u in 2-3 months        Penne Skye, MD 03/28/2024  [facility] [address], [address] [place], [place] [address] [phone]

## 2024-03-21 NOTE — Assessment & Plan Note (Addendum)
 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 For many patients, symptoms are self-limited and respond to conservative management. Initial strategies include NSAIDs, scrotal support, ice packs, and/or warm sitz baths. Identifying exacerbating triggers, modification of activities and/or premedication can help reduce episodes and severity. For chronic syndromes, we may consider pelvic floor physical therapy or referral to a chronic pain specialist. In select cases, we may consider surgical treatment with anesthetic cord blocks +/- spermatic cord denervation.   -Mildly bothersome at present, likely improved with nonambulatory status -Attention to inciting triggers, premedication with NSAIDs - Interval scrotal ultrasound for findings below, as well as chronic scrotal pain - f/u in 2-3 months

## 2024-03-21 NOTE — Assessment & Plan Note (Addendum)
 4mm Right intratesticular cyst, outside US  report (Sept 2025)  Almost certainly a benign incidental finding, very common Although I can only trust outside report For this reason, repeat US  in 2-3 months to ensure stability

## 2024-03-28 ENCOUNTER — Ambulatory Visit (INDEPENDENT_AMBULATORY_CARE_PROVIDER_SITE_OTHER): Admitting: Urology

## 2024-03-28 VITALS — BP 132/81 | HR 76 | Ht 69.0 in | Wt 285.0 lb

## 2024-03-28 DIAGNOSIS — N5082 Scrotal pain: Secondary | ICD-10-CM

## 2024-03-28 DIAGNOSIS — N442 Benign cyst of testis: Secondary | ICD-10-CM

## 2024-03-28 NOTE — Patient Instructions (Signed)
 Please Call [phone] to schedule ultrasound

## 2024-05-02 ENCOUNTER — Ambulatory Visit
Admission: RE | Admit: 2024-05-02 | Discharge: 2024-05-02 | Disposition: A | Discharge status: Home / Self Care | Attending: Nurse Practitioner | Admitting: Nurse Practitioner

## 2024-05-02 ENCOUNTER — Ambulatory Visit
Admission: RE | Admit: 2024-05-02 | Discharge: 2024-05-02 | Disposition: A | Discharge status: Home / Self Care | Source: Ambulatory Visit | Attending: Nurse Practitioner | Admitting: Nurse Practitioner

## 2024-05-02 ENCOUNTER — Other Ambulatory Visit: Payer: Self-pay | Admitting: Nurse Practitioner

## 2024-05-02 DIAGNOSIS — K5900 Constipation, unspecified: Secondary | ICD-10-CM

## 2024-05-10 ENCOUNTER — Encounter: Payer: Self-pay | Admitting: Urology

## 2024-05-11 NOTE — Progress Notes (Signed)
 Review of Systems   Respiratory: Negative.     Cardiovascular: Negative.    Neurological: Negative.

## 2024-06-26 ENCOUNTER — Other Ambulatory Visit: Payer: Self-pay

## 2024-06-26 DIAGNOSIS — N442 Benign cyst of testis: Secondary | ICD-10-CM

## 2024-07-02 ENCOUNTER — Ambulatory Visit
Admission: RE | Admit: 2024-07-02 | Discharge: 2024-07-02 | Disposition: A | Discharge status: Home / Self Care | Source: Ambulatory Visit | Attending: Urology | Admitting: Urology

## 2024-07-02 ENCOUNTER — Ambulatory Visit: Admitting: Urology

## 2024-07-02 DIAGNOSIS — N442 Benign cyst of testis: Secondary | ICD-10-CM | POA: Insufficient documentation

## 2024-07-05 DIAGNOSIS — F419 Anxiety disorder, unspecified: Secondary | ICD-10-CM | POA: Insufficient documentation

## 2024-07-05 NOTE — Progress Notes (Unsigned)
 "    07/06/2024 8:52 AM   Bruce Evans Sep 01, 1980 [id number]  Referring provider: No referring provider defined for this encounter.  Urological history: 1. Hypogonadism - managed by PCP  2. Testicular cyst  - Scrotal US  (02/2024) - right 4 mm testicular cyst - Scrotal US  (06/2024) - right 4 mm testicular cyst   No chief complaint on file.  HPI: Bruce Evans is a 44 y.o. man who presents today for follow up.    Previous records reviewed.  He has been experiencing scrotal pain for several weeks, but it has eased after he had fallen off a chair and broke his right leg.  He was seen by Dr. Georganne in October and a repeat scrotal ultrasound was ordered as we could not view the images of previous ultrasound.    Scrotal ultra sound completed on January 12, notes a stable 4 mm right testicular cyst.  PMH: Past Medical History:  Diagnosis Date   Anxiety    Hypercholesteremia     Surgical History: No past surgical history on file.  Home Medications:  Allergies as of 07/06/2024   No Known Allergies      Medication List        Accurate as of July 05, 2024  8:52 AM. If you have any questions, ask your nurse or doctor.          ALPRAZolam 0.25 MG tablet Commonly known as: XANAX Take 1 tablet by mouth daily as needed.   amoxicillin 500 MG capsule Commonly known as: AMOXIL Take 2 capsules every 12 hours by oral route for 10 days.   atorvastatin 10 MG tablet Commonly known as: LIPITOR   azithromycin 250 MG tablet Commonly known as: ZITHROMAX   chlorhexidine 0.12 % solution Commonly known as: PERIDEX   DULoxetine 30 MG capsule Commonly known as: CYMBALTA TAKE 1 CAPSULE BY MOUTH ONCE DAILY FOR 30 DAYS   FLUoxetine 40 MG capsule Commonly known as: PROZAC Take 40 mg by mouth daily.   hydrochlorothiazide 12.5 MG tablet Commonly known as: HYDRODIURIL Take 12.5 mg by mouth daily.   HYDROcodone -acetaminophen  7.5-325 MG tablet Commonly known as: NORCO   losartan  50 MG tablet Commonly known as: COZAAR Take 1 tablet by mouth daily.   meloxicam  15 MG tablet Commonly known as: MOBIC  Take 1 tablet by mouth daily.   OLANZapine-FLUoxetine 3-25 MG capsule Commonly known as: SYMBYAX Take 1 capsule by mouth daily.   ondansetron 4 MG tablet Commonly known as: ZOFRAN Take 4 mg by mouth every 8 (eight) hours as needed.   oxyCODONE 5 MG immediate release tablet Commonly known as: Oxy IR/ROXICODONE Take 5-10 mg by mouth every 6 (six) hours as needed.   predniSONE  10 MG tablet Commonly known as: DELTASONE    temazepam 15 MG capsule Commonly known as: RESTORIL   Wegovy 0.25 MG/0.5ML Soaj SQ injection Generic drug: semaglutide-weight management Inject by subcutaneous route for 28 days.        Allergies: Allergies[1]  Family History: No family history on file.  Social History:  reports that he has been smoking. He has never used smokeless tobacco. He reports current alcohol use. He reports that he does not use drugs.  ROS: Pertinent ROS in HPI  Physical Exam: There were no vitals taken for this visit.  Constitutional:  Well nourished. Alert and oriented, No acute distress. HEENT: [place] AT, moist mucus membranes.  Trachea midline, no masses. Cardiovascular: No clubbing, cyanosis, or edema. Respiratory: Normal respiratory effort, no increased work of breathing.  GI: Abdomen is soft, non tender, non distended, no abdominal masses. Liver and spleen not palpable.  No hernias appreciated.  Stool sample for occult testing is not indicated.   GU: No CVA tenderness.  No bladder fullness or masses.  Patient with circumcised/uncircumcised phallus. ***Foreskin easily retracted***  Urethral meatus is patent.  No penile discharge. No penile lesions or rashes. Scrotum without lesions, cysts, rashes and/or edema.  Testicles are located scrotally bilaterally. No masses are appreciated in the testicles. Left and right epididymis are normal. Rectal: Patient with   normal sphincter tone. Anus and perineum without scarring or rashes. No rectal masses are appreciated. Prostate is approximately *** grams, *** nodules are appreciated. Seminal vesicles are normal. Skin: No rashes, bruises or suspicious lesions. Lymph: No cervical or inguinal adenopathy. Neurologic: Grossly intact, no focal deficits, moving all 4 extremities. Psychiatric: Normal mood and affect.  Laboratory Data: See Epic and HPI   I have reviewed the labs.   Pertinent Imaging: Scrotal ultrasound, radiologist interpretation still pending  I have independently reviewed the films.  See HPI.   Assessment & Plan:  ***  1. Right testicular cyst - explained that this is benign incidental finding and has not changed since previous ultrasound    No follow-ups on file.  These notes generated with voice recognition software. I apologize for typographical errors.  CLOTILDA CORNWALL, PA-C  [facility] [address]  [address] [place], [place] [address] [phone]     [1] No Known Allergies  "

## 2024-07-06 ENCOUNTER — Ambulatory Visit: Admitting: Urology

## 2024-07-06 DIAGNOSIS — N442 Benign cyst of testis: Secondary | ICD-10-CM

## 2024-07-09 NOTE — Progress Notes (Unsigned)
 "    07/10/2024 4:22 PM   Bruce Evans 1980/09/18 [id number]  Referring provider: No referring provider defined for this encounter.  Urological history: 1. Hypogonadism - managed by PCP  2. Testicular cyst  - Scrotal US  (02/2024) - right 4 mm testicular cyst - Scrotal US  (06/2024) - right 4 mm testicular cyst   No chief complaint on file.  HPI: Bruce Evans is a 44 y.o. man who presents today for follow up.    Previous records reviewed.  He has been experiencing scrotal pain for several weeks, but it has eased after he had fallen off a chair and broke his right leg.  He was seen by Dr. Georganne in October and a repeat scrotal ultrasound was ordered as we could not view the images of previous ultrasound.    Scrotal ultra sound completed on January 12, notes a stable 4 mm right testicular cyst.  PMH: Past Medical History:  Diagnosis Date   Anxiety    Hypercholesteremia     Surgical History: No past surgical history on file.  Home Medications:  Allergies as of 07/10/2024   No Known Allergies      Medication List        Accurate as of July 09, 2024  4:22 PM. If you have any questions, ask your nurse or doctor.          ALPRAZolam 0.25 MG tablet Commonly known as: XANAX Take 1 tablet by mouth daily as needed.   amoxicillin 500 MG capsule Commonly known as: AMOXIL Take 2 capsules every 12 hours by oral route for 10 days.   atorvastatin 10 MG tablet Commonly known as: LIPITOR   azithromycin 250 MG tablet Commonly known as: ZITHROMAX   chlorhexidine 0.12 % solution Commonly known as: PERIDEX   DULoxetine 30 MG capsule Commonly known as: CYMBALTA TAKE 1 CAPSULE BY MOUTH ONCE DAILY FOR 30 DAYS   FLUoxetine 40 MG capsule Commonly known as: PROZAC Take 40 mg by mouth daily.   hydrochlorothiazide 12.5 MG tablet Commonly known as: HYDRODIURIL Take 12.5 mg by mouth daily.   HYDROcodone -acetaminophen  7.5-325 MG tablet Commonly known as: NORCO   losartan  50 MG tablet Commonly known as: COZAAR Take 1 tablet by mouth daily.   meloxicam  15 MG tablet Commonly known as: MOBIC  Take 1 tablet by mouth daily.   OLANZapine-FLUoxetine 3-25 MG capsule Commonly known as: SYMBYAX Take 1 capsule by mouth daily.   ondansetron 4 MG tablet Commonly known as: ZOFRAN Take 4 mg by mouth every 8 (eight) hours as needed.   oxyCODONE 5 MG immediate release tablet Commonly known as: Oxy IR/ROXICODONE Take 5-10 mg by mouth every 6 (six) hours as needed.   predniSONE  10 MG tablet Commonly known as: DELTASONE    temazepam 15 MG capsule Commonly known as: RESTORIL   Wegovy 0.25 MG/0.5ML Soaj SQ injection Generic drug: semaglutide-weight management Inject by subcutaneous route for 28 days.        Allergies: Allergies[1]  Family History: No family history on file.  Social History:  reports that he has been smoking. He has never used smokeless tobacco. He reports current alcohol use. He reports that he does not use drugs.  ROS: Pertinent ROS in HPI  Physical Exam: There were no vitals taken for this visit.  Constitutional:  Well nourished. Alert and oriented, No acute distress. HEENT: [place] AT, moist mucus membranes.  Trachea midline, no masses. Cardiovascular: No clubbing, cyanosis, or edema. Respiratory: Normal respiratory effort, no increased work of breathing.  GI: Abdomen is soft, non tender, non distended, no abdominal masses. Liver and spleen not palpable.  No hernias appreciated.  Stool sample for occult testing is not indicated.   GU: No CVA tenderness.  No bladder fullness or masses.  Patient with circumcised/uncircumcised phallus. ***Foreskin easily retracted***  Urethral meatus is patent.  No penile discharge. No penile lesions or rashes. Scrotum without lesions, cysts, rashes and/or edema.  Testicles are located scrotally bilaterally. No masses are appreciated in the testicles. Left and right epididymis are normal. Rectal: Patient with   normal sphincter tone. Anus and perineum without scarring or rashes. No rectal masses are appreciated. Prostate is approximately *** grams, *** nodules are appreciated. Seminal vesicles are normal. Skin: No rashes, bruises or suspicious lesions. Lymph: No cervical or inguinal adenopathy. Neurologic: Grossly intact, no focal deficits, moving all 4 extremities. Psychiatric: Normal mood and affect.  Laboratory Data: See Epic and HPI   I have reviewed the labs.   Pertinent Imaging: Narrative & Impression  SCROTAL ULTRASOUND AND SCROTAL COLOR AND SPECTRAL DOPPLER ULTRASOUND   CLINICAL HISTORY: Testicular cyst   TECHNIQUE: Grey scale ultrasound imaging of the testes and scrotum was performed. Color and spectral Doppler imaging was also performed in order to assess for torsion.   COMPARISON: No prior study for comparison.   FINDINGS: Right Testis: Measures 4.9 x 2.3 x 2.9 cm. Normal size and echotexture. Cyst in the superior aspect of the testis measures 0.4 x 0.3 x 0.5 cm. Color and spectral Doppler examination demonstrates normal arterial and venous blood flow to the right testes. Right Epididymis: Normal. No hyperemia. Comments: No right hydrocele or varicocele.   Left Testis: Measures: 4.8 x 2.1 x 2.9 cm. Normal size and echotexture with no masses. Color and spectral Doppler examination demonstrates normal arterial and venous blood flow to the left testes. Left Epididymis: Normal. No hyperemia. Comments: No left hydrocele or varicocele.     IMPRESSION: Small cyst in the superior aspect of the right testis.   Electronically signed by: Venetia Neer MD 07/07/2024 08:45 AM EST RP Workstation: [id number]  I have independently reviewed the films.  See HPI.   Assessment & Plan:  ***  1. Right testicular cyst - explained that this is benign incidental finding and has not changed since previous ultrasound    No follow-ups on file.  These notes generated with voice  recognition software. I apologize for typographical errors.  CLOTILDA CORNWALL, PA-C  [facility] [address]  [address] [place], [place] [address] [phone]     [1] No Known Allergies  "

## 2024-07-10 ENCOUNTER — Ambulatory Visit

## 2024-07-10 VITALS — BP 154/81 | HR 74 | Wt 289.0 lb

## 2024-07-10 DIAGNOSIS — N529 Male erectile dysfunction, unspecified: Secondary | ICD-10-CM | POA: Diagnosis not present

## 2024-07-10 DIAGNOSIS — N442 Benign cyst of testis: Secondary | ICD-10-CM

## 2024-07-10 MED ORDER — TADALAFIL 5 MG PO TABS: 5.0000 mg | ORAL_TABLET | Freq: Every day | ORAL | 3 refills | Status: AC | PRN

## 2024-07-10 NOTE — Patient Instructions (Addendum)
 Understanding Erectile Dysfunction (ED)  What is ED? Erectile Dysfunction, or ED, is when a man has trouble getting or keeping an erection enough for sex. It is common and can happen at any age but is more common as men get older.  What Causes ED? ED can happen for many reasons, including:    Stress or feeling anxious Health problems like diabetes, heart disease, or high blood pressure Lifestyle habits like smoking, drinking too much alcohol, drug use, or not enough exercise Certain medicines(some blood pressure medications, antidepressants, sedatives)  Symptoms of ED:   Difficulty getting an erection Trouble keeping an erection during sex Reduced interest in sex  Treatment Options There are different ways to treat ED. Talk to your doctor to find what's best for you.  Lifestyle Changes   Exercise regularly Eat healthy foods Quit smoking and limit alcohol Weight loss Reduce stress  Medications   Oral medicines like Viagra(sildenafil) or Cialis (tadalafil ).  These are generic and affordable, the lowest price is at [email or web address]com.  These must be taken about an hour before sexual activity, and still require sexual stimulation to get an erection Side effects can include stuffy nose, headache, muscle pain, or changes in vision There is no risk of heart attack or stroke when medications are taken as directed These medications cannot be taken with nitrates for chest pain  Other Treatments    Penile injections-a medicine is injected directly into the penis to give you an immediate erection Devices like pump vacuum systems that help create an erection Surgery: Penile prosthesis for patients with no improvement with medicines or injections who are still interested in sexual activity.  Visit [email or web address] for more details.  Dr. Lovie is a urologist in [facility] with [facility] who performs penile prosthesis surgeries Counseling or Therapy    If ED is caused by  stress, anxiety, or relationship issues, talking to a counselor or therapist can help.

## 2024-09-10 ENCOUNTER — Ambulatory Visit
